# Patient Record
Sex: Female | Born: 1965 | Race: White | Hispanic: No | Smoking: Current every day smoker
Health system: Southern US, Community
[De-identification: ages and names within clinical notes are randomized; demographics above are authoritative.]

## PROBLEM LIST (undated history)

## (undated) DIAGNOSIS — C73 Malignant neoplasm of thyroid gland: Secondary | ICD-10-CM

## (undated) DIAGNOSIS — E039 Hypothyroidism, unspecified: Secondary | ICD-10-CM

## (undated) HISTORY — PX: THYROIDECTOMY: SHX17

---

## 2007-04-20 ENCOUNTER — Emergency Department: Payer: Self-pay | Admitting: Emergency Medicine

## 2007-07-20 ENCOUNTER — Emergency Department: Payer: Self-pay | Admitting: Emergency Medicine

## 2009-08-18 ENCOUNTER — Emergency Department: Payer: Self-pay | Admitting: Emergency Medicine

## 2010-10-31 ENCOUNTER — Emergency Department: Payer: Self-pay | Admitting: Emergency Medicine

## 2015-11-19 ENCOUNTER — Emergency Department
Admission: EM | Admit: 2015-11-19 | Discharge: 2015-11-19 | Disposition: A | Discharge status: Home / Self Care | Payer: Self-pay | Attending: Emergency Medicine | Admitting: Emergency Medicine

## 2015-11-19 ENCOUNTER — Encounter: Payer: Self-pay | Admitting: Emergency Medicine

## 2015-11-19 DIAGNOSIS — Y939 Activity, unspecified: Secondary | ICD-10-CM | POA: Insufficient documentation

## 2015-11-19 DIAGNOSIS — X58XXXA Exposure to other specified factors, initial encounter: Secondary | ICD-10-CM | POA: Insufficient documentation

## 2015-11-19 DIAGNOSIS — S1096XA Insect bite of unspecified part of neck, initial encounter: Secondary | ICD-10-CM | POA: Insufficient documentation

## 2015-11-19 DIAGNOSIS — W57XXXA Bitten or stung by nonvenomous insect and other nonvenomous arthropods, initial encounter: Secondary | ICD-10-CM

## 2015-11-19 DIAGNOSIS — Y929 Unspecified place or not applicable: Secondary | ICD-10-CM | POA: Insufficient documentation

## 2015-11-19 DIAGNOSIS — L089 Local infection of the skin and subcutaneous tissue, unspecified: Secondary | ICD-10-CM

## 2015-11-19 DIAGNOSIS — Z85850 Personal history of malignant neoplasm of thyroid: Secondary | ICD-10-CM | POA: Insufficient documentation

## 2015-11-19 DIAGNOSIS — F17210 Nicotine dependence, cigarettes, uncomplicated: Secondary | ICD-10-CM | POA: Insufficient documentation

## 2015-11-19 DIAGNOSIS — Y999 Unspecified external cause status: Secondary | ICD-10-CM | POA: Insufficient documentation

## 2015-11-19 HISTORY — DX: Malignant neoplasm of thyroid gland: C73

## 2015-11-19 MED ORDER — SULFAMETHOXAZOLE-TRIMETHOPRIM 800-160 MG PO TABS: 1.0000 | ORAL_TABLET | Freq: Two times a day (BID) | ORAL | 0 refills | Status: DC

## 2015-11-19 MED ORDER — TRAMADOL HCL 50 MG PO TABS: 50.0000 mg | ORAL_TABLET | Freq: Four times a day (QID) | ORAL | 0 refills | Status: DC | PRN

## 2015-11-19 NOTE — ED Triage Notes (Signed)
Red raised area back of neck x 3 days. States she does not think it started as a bite.

## 2015-11-19 NOTE — ED Provider Notes (Signed)
[facility] Emergency Department Provider Note   ____________________________________________   None    (approximate)  I have reviewed the triage vital signs and the nursing notes.   HISTORY  Chief Complaint Abscess    HPI Krista Hill is a 50 y.o. female patient complain of redness and swelling to the right posterior aspect of neck for 3 days. Patient states she does not remember insect bite or sting. Patient states pain has become worse in the past 24 hours. Patient rates the pain as 8/10. Patient described a pain as "sharp". No palliative measures taken for this complaint.   Past Medical History:  Diagnosis Date  . Thyroid cancer ([place])     There are no active problems to display for this patient.   Past Surgical History:  Procedure Laterality Date  . THYROIDECTOMY      Prior to Admission medications   Medication Sig Start Date End Date Taking? Authorizing Provider  sulfamethoxazole-trimethoprim (BACTRIM DS,SEPTRA DS) 800-160 MG tablet Take 1 tablet by mouth 2 (two) times daily. 11/19/15   Sable Feil, PA-C  traMADol (ULTRAM) 50 MG tablet Take 1 tablet (50 mg total) by mouth every 6 (six) hours as needed for moderate pain. 11/19/15   Sable Feil, PA-C    Allergies Amoxicillin  No family history on file.  Social History Social History  Substance Use Topics  . Smoking status: Current Every Day Smoker    Packs/day: 1.00    Types: Cigarettes  . Smokeless tobacco: Not on file  . Alcohol use No    Review of Systems Constitutional: No fever/chills Eyes: No visual changes. ENT: No sore throat. Cardiovascular: Denies chest pain. Respiratory: Denies shortness of breath. Gastrointestinal: No abdominal pain.  No nausea, no vomiting.  No diarrhea.  No constipation. Genitourinary: Negative for dysuria. Musculoskeletal: Negative for back pain. Skin: Negative for rash. Neurological: Negative for headaches, focal weakness or  numbness.    ____________________________________________   PHYSICAL EXAM:  VITAL SIGNS: ED Triage Vitals  Enc Vitals Group     BP 11/19/15 0932 (!) 144/78     Pulse Rate 11/19/15 0932 80     Resp 11/19/15 0932 18     Temp 11/19/15 0932 98.2 F (36.8 C)     Temp Source 11/19/15 0932 Oral     SpO2 11/19/15 0932 96 %     Weight 11/19/15 0934 165 lb (74.8 kg)     Height 11/19/15 0934 5\' 2"  (1.575 m)     Head Circumference --      Peak Flow --      Pain Score 11/19/15 0934 8     Pain Loc --      Pain Edu? --      Excl. in [place]? --     Constitutional: Alert and oriented. Well appearing and in no acute distress. Eyes: Conjunctivae are normal. PERRL. EOMI. Head: Atraumatic. Nose: No congestion/rhinnorhea. Mouth/Throat: Mucous membranes are moist.  Oropharynx non-erythematous. Neck: No stridor.  No cervical spine tenderness to palpation. Hematological/Lymphatic/Immunilogical: No cervical lymphadenopathy. Cardiovascular: Normal rate, regular rhythm. Grossly normal heart sounds.  Good peripheral circulation. Respiratory: Normal respiratory effort.  No retractions. Lungs CTAB. Gastrointestinal: Soft and nontender. No distention. No abdominal bruits. No CVA tenderness. Musculoskeletal: No lower extremity tenderness nor edema.  No joint effusions. Neurologic:  Normal speech and language. No gross focal neurologic deficits are appreciated. No gait instability. Skin:  Papular lesions on erythematous base. Psychiatric: Mood and affect are normal. Speech and  behavior are normal.  ____________________________________________   LABS (all labs ordered are listed, but only abnormal results are displayed)  Labs Reviewed - No data to display ____________________________________________  EKG   ____________________________________________  RADIOLOGY   ____________________________________________   PROCEDURES  Procedure(s) performed: None  Procedures  Critical Care performed:  No  ____________________________________________   INITIAL IMPRESSION / ASSESSMENT AND PLAN / ED COURSE  Pertinent labs & imaging results that were available during my care of the patient were reviewed by me and considered in my medical decision making (see chart for details).  Infected insect bite. Patient given discharge Instructions. Patient get a prescription for Bactrim DS and tramadol. Patient given a work note for today. Patient advised [facility].  Clinical Course     ____________________________________________   FINAL CLINICAL IMPRESSION(S) / ED DIAGNOSES  Final diagnoses:  Infected insect bite of neck, initial encounter      NEW MEDICATIONS STARTED DURING THIS VISIT:  New Prescriptions   SULFAMETHOXAZOLE-TRIMETHOPRIM (BACTRIM DS,SEPTRA DS) 800-160 MG TABLET    Take 1 tablet by mouth 2 (two) times daily.   TRAMADOL (ULTRAM) 50 MG TABLET    Take 1 tablet (50 mg total) by mouth every 6 (six) hours as needed for moderate pain.     Note:  This document was prepared using Dragon voice recognition software and may include unintentional dictation errors.    Sable Feil, PA-C 11/19/15 1000    Harvest Dark, MD 11/19/15 [phone]

## 2017-07-28 ENCOUNTER — Emergency Department: Payer: Self-pay

## 2017-07-28 ENCOUNTER — Other Ambulatory Visit: Payer: Self-pay

## 2017-07-28 ENCOUNTER — Emergency Department
Admission: EM | Admit: 2017-07-28 | Discharge: 2017-07-28 | Disposition: A | Discharge status: Home / Self Care | Payer: Self-pay | Attending: Emergency Medicine | Admitting: Emergency Medicine

## 2017-07-28 ENCOUNTER — Encounter: Payer: Self-pay | Admitting: Emergency Medicine

## 2017-07-28 DIAGNOSIS — R112 Nausea with vomiting, unspecified: Secondary | ICD-10-CM | POA: Insufficient documentation

## 2017-07-28 DIAGNOSIS — N23 Unspecified renal colic: Secondary | ICD-10-CM | POA: Insufficient documentation

## 2017-07-28 DIAGNOSIS — F17210 Nicotine dependence, cigarettes, uncomplicated: Secondary | ICD-10-CM | POA: Insufficient documentation

## 2017-07-28 DIAGNOSIS — Z85850 Personal history of malignant neoplasm of thyroid: Secondary | ICD-10-CM | POA: Insufficient documentation

## 2017-07-28 DIAGNOSIS — Z79899 Other long term (current) drug therapy: Secondary | ICD-10-CM | POA: Insufficient documentation

## 2017-07-28 LAB — LIPASE, BLOOD: LIPASE: 25 U/L (ref 11–51)

## 2017-07-28 LAB — COMPREHENSIVE METABOLIC PANEL
ALBUMIN: 3.8 g/dL (ref 3.5–5.0)
ALT: 18 U/L (ref 14–54)
AST: 21 U/L (ref 15–41)
Alkaline Phosphatase: 93 U/L (ref 38–126)
Anion gap: 6 (ref 5–15)
BILIRUBIN TOTAL: 0.6 mg/dL (ref 0.3–1.2)
BUN: 12 mg/dL (ref 6–20)
CHLORIDE: 108 mmol/L (ref 101–111)
CO2: 23 mmol/L (ref 22–32)
CREATININE: 0.59 mg/dL (ref 0.44–1.00)
Calcium: 8.7 mg/dL — ABNORMAL LOW (ref 8.9–10.3)
GFR calc Af Amer: >60 mL/min (ref >60)
GFR calc non Af Amer: >60 mL/min (ref >60)
GLUCOSE: 105 mg/dL — AB (ref 65–99)
POTASSIUM: 3.8 mmol/L (ref 3.5–5.1)
Sodium: 137 mmol/L (ref 135–145)
Total Protein: 7.6 g/dL (ref 6.5–8.1)

## 2017-07-28 LAB — CBC
HEMATOCRIT: 46.3 % (ref 35.0–47.0)
Hemoglobin: 15.9 g/dL (ref 12.0–16.0)
MCH: 31.5 pg (ref 26.0–34.0)
MCHC: 34.4 g/dL (ref 32.0–36.0)
MCV: 91.7 fL (ref 80.0–100.0)
PLATELETS: 322 10*3/uL (ref 150–440)
RBC: 5.05 MIL/uL (ref 3.80–5.20)
RDW: 12.9 % (ref 11.5–14.5)
WBC: 11.7 10*3/uL — ABNORMAL HIGH (ref 3.6–11.0)

## 2017-07-28 LAB — URINALYSIS, COMPLETE (UACMP) WITH MICROSCOPIC
BILIRUBIN URINE: NEGATIVE
Bacteria, UA: NONE SEEN
Glucose, UA: NEGATIVE mg/dL
KETONES UR: NEGATIVE mg/dL
LEUKOCYTES UA: NEGATIVE
NITRITE: NEGATIVE
PH: 6 (ref 5.0–8.0)
Protein, ur: NEGATIVE mg/dL
SPECIFIC GRAVITY, URINE: 1.041 — AB (ref 1.005–1.030)

## 2017-07-28 MED ORDER — ACETAMINOPHEN 325 MG PO TABS
650.0000 mg | ORAL_TABLET | Freq: Once | ORAL | Status: AC
  Administered 2017-07-28: 650 mg via ORAL

## 2017-07-28 MED ORDER — ACETAMINOPHEN 325 MG PO TABS
ORAL_TABLET | ORAL | Status: AC
Start: 2017-07-28 — End: 2017-07-28
  Administered 2017-07-28: 650 mg via ORAL
  Filled 2017-07-28: qty 2

## 2017-07-28 MED ORDER — IOPAMIDOL (ISOVUE-300) INJECTION 61%
30.0000 mL | Freq: Once | INTRAVENOUS | Status: AC | PRN
  Administered 2017-07-28: 30 mL via ORAL

## 2017-07-28 MED ORDER — OXYCODONE-ACETAMINOPHEN 5-325 MG PO TABS: 1.0000 | ORAL_TABLET | Freq: Four times a day (QID) | ORAL | 0 refills | Status: DC | PRN

## 2017-07-28 MED ORDER — TAMSULOSIN HCL 0.4 MG PO CAPS: 0.4000 mg | ORAL_CAPSULE | Freq: Every day | ORAL | 0 refills | Status: DC

## 2017-07-28 MED ORDER — IOPAMIDOL (ISOVUE-300) INJECTION 61%
100.0000 mL | Freq: Once | INTRAVENOUS | Status: AC | PRN
  Administered 2017-07-28: 100 mL via INTRAVENOUS

## 2017-07-28 NOTE — ED Triage Notes (Signed)
Pt presents to ED via POV with c/o LLQ abdominal pain that radiates to her back. Pt also c/o nausea and vomiting. Pt states has had this pain before and the pain eased up.

## 2017-07-28 NOTE — Discharge Instructions (Signed)
If Tylenol that you wanted to use is not enough for the pain please try the Percocet 1 or 2 pills 4 times a day as needed for pain. You can start with one wait about half an hour and if it is not enough try a second one. Do not take both Tylenol and the Percocet as that will be too much Tylenol and it could damage your liver. please follow-up with Dr. Erlene Quan a urologist. Please return here for worse pain fever or vomiting.

## 2017-07-28 NOTE — ED Notes (Signed)
Pt in NAD at this time and is able to ambulate without difficulty. Family at bedside and able to help pt home. PT verbalized understanding of follow up care and RN gave pt a urine strainer.

## 2017-07-28 NOTE — ED Notes (Signed)
Pt drinking contrast. NAD noted and no reports of nausea.

## 2017-07-28 NOTE — ED Provider Notes (Signed)
[facility] Emergency Department Provider Note   ____________________________________________   First MD Initiated Contact with Patient 07/28/17 1230     (approximate)  I have reviewed the triage vital signs and the nursing notes.   HISTORY  Chief Complaint Abdominal Pain   HPI Krista Hill is a 52 y.o. female Patient reports last week she had a brief episode of left lower quadrant pain and it went away and then today while she was in Sunday school at church pain came on fairly severely and left lower quadrant she went to try to have a bowel movement because she thought that might help but it didn't she is now having left lower quadrant pain radiating to the back is worse with movement percussion and palpation.pain is moderately severe but patient does not want any narcotics or more free and she is deathly scared of him due to all the problems in the overdoses. She just wants Tylenol.   Past Medical History:  Diagnosis Date  . Thyroid cancer ([place])     There are no active problems to display for this patient.   Past Surgical History:  Procedure Laterality Date  . THYROIDECTOMY      Prior to Admission medications   Medication Sig Start Date End Date Taking? Authorizing Provider  oxyCODONE-acetaminophen (PERCOCET/ROXICET) 5-325 MG tablet Take 1-2 tablets by mouth every 6 (six) hours as needed for severe pain. 07/28/17   Nena Polio, MD  sulfamethoxazole-trimethoprim (BACTRIM DS,SEPTRA DS) 800-160 MG tablet Take 1 tablet by mouth 2 (two) times daily. 11/19/15   Sable Feil, PA-C  tamsulosin (FLOMAX) 0.4 MG CAPS capsule Take 1 capsule (0.4 mg total) by mouth daily. 07/28/17   Nena Polio, MD  traMADol (ULTRAM) 50 MG tablet Take 1 tablet (50 mg total) by mouth every 6 (six) hours as needed for moderate pain. 11/19/15   Sable Feil, PA-C    Allergies Amoxicillin  History reviewed. No pertinent family history.  Social History Social  History   Tobacco Use  . Smoking status: Current Every Day Smoker    Packs/day: 1.00    Types: Cigarettes  . Smokeless tobacco: Never Used  Substance Use Topics  . Alcohol use: No  . Drug use: Not on file    Review of Systems  Constitutional: No fever/chills Eyes: No visual changes. ENT: No sore throat. Cardiovascular: Denies chest pain. Respiratory: Denies shortness of breath. Gastrointestinal:  abdominal pain.  nausea, no vomiting.  No diarrhea.  No constipation. Genitourinary: Negative for dysuria. Musculoskeletal: Negative for back pain. Skin: Negative for rash. Neurological: Negative for headaches, focal weakness   ____________________________________________   PHYSICAL EXAM:  VITAL SIGNS: ED Triage Vitals  Enc Vitals Group     BP 07/28/17 1148 (!) 148/79     Pulse Rate 07/28/17 1148 83     Resp 07/28/17 1148 (!) 22     Temp 07/28/17 1148 98.4 F (36.9 C)     Temp Source 07/28/17 1148 Oral     SpO2 07/28/17 1148 95 %     Weight 07/28/17 1149 170 lb (77.1 kg)     Height 07/28/17 1149 5\' 2"  (1.575 m)     Head Circumference --      Peak Flow --      Pain Score 07/28/17 1148 10     Pain Loc --      Pain Edu? --      Excl. in [place]? --     Constitutional: Alert  and oriented. Well appearing and in moderate acute distress. Eyes: Conjunctivae are normal.  Head: Atraumatic. Nose: No congestion/rhinnorhea. Mouth/Throat: Mucous membranes are moist.  Oropharynx non-erythematous. Neck: No stridor.   Cardiovascular: Normal rate, regular rhythm. Grossly normal heart sounds.  Good peripheral circulation. Respiratory: Normal respiratory effort.  No retractions. Lungs CTAB. Gastrointestinal: Soft tender to palpation percussion left lower quadrant. No distention. No abdominal bruits. No CVA tenderness. Musculoskeletal: No lower extremity tenderness nor edema.  No joint effusions. Neurologic:  Normal speech and language. No gross focal neurologic deficits are appreciated. No  gait instability. Skin:  Skin is warm, dry and intact. No rash noted. Psychiatric: Mood and affect are normal. Speech and behavior are normal.  ____________________________________________   LABS (all labs ordered are listed, but only abnormal results are displayed)  Labs Reviewed  COMPREHENSIVE METABOLIC PANEL - Abnormal; Notable for the following components:      Result Value   Glucose, Bld 105 (*)    Calcium 8.7 (*)    All other components within normal limits  CBC - Abnormal; Notable for the following components:   WBC 11.7 (*)    All other components within normal limits  URINALYSIS, COMPLETE (UACMP) WITH MICROSCOPIC - Abnormal; Notable for the following components:   Color, Urine STRAW (*)    APPearance CLEAR (*)    Specific Gravity, Urine 1.041 (*)    Hgb urine dipstick LARGE (*)    All other components within normal limits  LIPASE, BLOOD   ____________________________________________  EKG   ____________________________________________  RADIOLOGY  ED MD interpretation:    Official radiology report(s): Ct Abdomen Pelvis W Contrast  Result Date: 07/28/2017 CLINICAL DATA:  Left lower quadrant abdominal pain radiating to the back. Nausea and vomiting. Smoker. EXAM: CT ABDOMEN AND PELVIS WITH CONTRAST TECHNIQUE: Multidetector CT imaging of the abdomen and pelvis was performed using the standard protocol following bolus administration of intravenous contrast. CONTRAST:  149mL ISOVUE-300 IOPAMIDOL (ISOVUE-300) INJECTION 61% COMPARISON:  None. FINDINGS: Lower chest: Right lower lobe bullous changes. Minimal dependent atelectasis on the left. Hepatobiliary: 1.5 cm cyst at the medial aspect of the caudate lobe of the liver. Normal appearing gallbladder. Pancreas: Unremarkable. No pancreatic ductal dilatation or surrounding inflammatory changes. Spleen: Normal in size without focal abnormality. Adrenals/Urinary Tract: 5 mm calculus at the left ureteropelvic junction. Associated  mild dilatation of the left renal collecting system. Tiny lower pole right renal calculus on coronal image number 56. No bladder or right ureteral calculi. Normal appearing adrenal glands. Stomach/Bowel: Stomach is within normal limits. Appendix appears normal. No evidence of bowel wall thickening, distention, or inflammatory changes. Vascular/Lymphatic: Atheromatous arterial calcifications without aneurysm. No enlarged lymph nodes. Reproductive: Bilateral tubal ligation clips. Unremarkable uterus and ovaries. Other: Small umbilical hernia containing fat. Musculoskeletal: Mild lumbar and lower thoracic spine degenerative changes. IMPRESSION: 1. 5 mm left UPJ calculus causing mild left hydronephrosis. 2. Tiny, nonobstructing lower pole right renal calculus. 3. Right lower lobe paraseptal emphysema. Electronically Signed   By: Claudie Revering M.D.   On: 07/28/2017 14:17    ____________________________________________   PROCEDURES  Procedure(s) performed:   Procedures  Critical Care performed:   ____________________________________________   INITIAL IMPRESSION / ASSESSMENT AND PLAN / ED COURSE  by CT scan patient has renal colic. The UA versus out. There is no other sign of anything going on. I will give her some pain medicine and have her follow-up with neurology.        ____________________________________________   FINAL CLINICAL IMPRESSION(S) /  ED DIAGNOSES  Final diagnoses:  Renal colic on left side     ED Discharge Orders        Ordered    tamsulosin (FLOMAX) 0.4 MG CAPS capsule  Daily     07/28/17 1431    oxyCODONE-acetaminophen (PERCOCET/ROXICET) 5-325 MG tablet  Every 6 hours PRN     07/28/17 1431       Note:  This document was prepared using Dragon voice recognition software and may include unintentional dictation errors.    Nena Polio, MD 07/28/17 [phone]

## 2017-08-09 ENCOUNTER — Ambulatory Visit (INDEPENDENT_AMBULATORY_CARE_PROVIDER_SITE_OTHER): Payer: Self-pay | Admitting: Urology

## 2017-08-09 ENCOUNTER — Encounter: Payer: Self-pay | Admitting: Urology

## 2017-08-09 ENCOUNTER — Ambulatory Visit
Admission: RE | Admit: 2017-08-09 | Discharge: 2017-08-09 | Disposition: A | Discharge status: Home / Self Care | Payer: Self-pay | Source: Ambulatory Visit | Attending: Urology | Admitting: Urology

## 2017-08-09 VITALS — BP 137/78 | HR 97 | Ht 61.0 in | Wt 164.0 lb

## 2017-08-09 DIAGNOSIS — N201 Calculus of ureter: Secondary | ICD-10-CM | POA: Insufficient documentation

## 2017-08-09 DIAGNOSIS — Z9851 Tubal ligation status: Secondary | ICD-10-CM | POA: Insufficient documentation

## 2017-08-09 LAB — URINALYSIS, COMPLETE
Bilirubin, UA: NEGATIVE
GLUCOSE, UA: NEGATIVE
KETONES UA: NEGATIVE
LEUKOCYTES UA: NEGATIVE
Nitrite, UA: NEGATIVE
PROTEIN UA: NEGATIVE
SPEC GRAV UA: 1.025 (ref 1.005–1.030)
Urobilinogen, Ur: 0.2 mg/dL (ref 0.2–1.0)
pH, UA: 5.5 (ref 5.0–7.5)

## 2017-08-09 LAB — MICROSCOPIC EXAMINATION

## 2017-08-09 MED ORDER — ONDANSETRON HCL 4 MG PO TABS: 4.0000 mg | ORAL_TABLET | Freq: Three times a day (TID) | ORAL | 0 refills | Status: AC | PRN

## 2017-08-09 MED ORDER — OXYCODONE-ACETAMINOPHEN 5-325 MG PO TABS: 1.0000 | ORAL_TABLET | Freq: Four times a day (QID) | ORAL | 0 refills | Status: AC | PRN

## 2017-08-09 MED ORDER — TAMSULOSIN HCL 0.4 MG PO CAPS: 0.4000 mg | ORAL_CAPSULE | Freq: Every day | ORAL | 0 refills | Status: AC

## 2017-08-09 NOTE — Progress Notes (Signed)
08/09/2017 1:40 PM   IOLANI TWILLEY 02/24/1966 [id number]  Referring provider: No referring provider defined for this encounter.  Chief Complaint  Patient presents with  . Nephrolithiasis    HPI: Krista Hill is a 52 year old female who presented to the [facility] ED on 07/28/2017 with left lower quadrant abdominal pain which radiated to the left flank region.  She had associated nausea without vomiting.  There were no identifiable precipitating, aggravating or alleviating factors.  She denied fever or chills.  Her pain was controlled with parenteral analgesics.  A stone protocol CT of the abdomen pelvis showed a 5 mm left UPJ calculus and a tiny nonobstructing right lower pole renal calculus.  She denies previous history of stone disease.  Since her ED visit her pain has been controlled with oxycodone.  Her pain is presently in the suprapubic region and associated with urinary frequency, urgency and voiding small amounts.  She has run out of tamsulosin.   PMH: Past Medical History:  Diagnosis Date  . Thyroid cancer [facility])     Surgical History: Past Surgical History:  Procedure Laterality Date  . THYROIDECTOMY      Home Medications:  Allergies as of 08/09/2017      Reactions   Amoxicillin Rash      Medication List        Accurate as of 08/09/17  1:40 PM. Always use your most recent med list.          oxyCODONE-acetaminophen 5-325 MG tablet Commonly known as:  PERCOCET/ROXICET Take 1-2 tablets by mouth every 6 (six) hours as needed for severe pain.   tamsulosin 0.4 MG Caps capsule Commonly known as:  FLOMAX Take 1 capsule (0.4 mg total) by mouth daily.       Allergies:  Allergies  Allergen Reactions  . Amoxicillin Rash    Family History: History reviewed. No pertinent family history.  Social History:  reports that she has been smoking cigarettes.  She has been smoking about 1.00 pack per day. She has never used smokeless tobacco. She reports that she does not  drink alcohol. Her drug history is not on file.  ROS: UROLOGY Frequent Urination?: No Hard to postpone urination?: Yes Burning/pain with urination?: No Get up at night to urinate?: Yes Leakage of urine?: Yes Urine stream starts and stops?: Yes Trouble starting stream?: Yes Do you have to strain to urinate?: No Blood in urine?: Yes Urinary tract infection?: Yes Sexually transmitted disease?: No Injury to kidneys or bladder?: No Painful intercourse?: No Weak stream?: Yes Currently pregnant?: No Vaginal bleeding?: No Last menstrual period?: n  Gastrointestinal Nausea?: Yes Vomiting?: No Indigestion/heartburn?: No Diarrhea?: Yes Constipation?: No  Constitutional Fever: No Night sweats?: No Weight loss?: No Fatigue?: No  Skin Skin rash/lesions?: No Itching?: No  Eyes Blurred vision?: No Double vision?: No  Ears/Nose/Throat Sore throat?: No Sinus problems?: No  Hematologic/Lymphatic Swollen glands?: No Easy bruising?: No  Cardiovascular Leg swelling?: No Chest pain?: No  Respiratory Cough?: No Shortness of breath?: No  Endocrine Excessive thirst?: No  Musculoskeletal Back pain?: Yes Joint pain?: No  Neurological Headaches?: No Dizziness?: No  Psychologic Depression?: No Anxiety?: Yes  Physical Exam: BP 137/78   Pulse 97   Ht 5\' 1"  (1.549 m)   Wt 164 lb (74.4 kg)   BMI 30.99 kg/m   Constitutional:  Alert and oriented, No acute distress. HEENT: [place] AT, moist mucus membranes.  Trachea midline, no masses. Cardiovascular: No clubbing, cyanosis, or edema.  RRR Respiratory: Normal  respiratory effort, no increased work of breathing.  Lungs clear GI: Abdomen is soft, nontender, nondistended, no abdominal masses GU: No CVA tenderness Lymph: No cervical or inguinal lymphadenopathy. Skin: No rashes, bruises or suspicious lesions. Neurologic: Grossly intact, no focal deficits, moving all 4 extremities. Psychiatric: Normal mood and  affect.  Laboratory Data: Lab Results  Component Value Date   WBC 11.7 (H) 07/28/2017   HGB 15.9 07/28/2017   HCT 46.3 07/28/2017   MCV 91.7 07/28/2017   PLT 322 07/28/2017    Lab Results  Component Value Date   CREATININE 0.59 07/28/2017     Pertinent Imaging: CT was personally reviewed.  Assessment & Plan:   52 year old female with a 5 mm left ureteral calculus.  Based on her symptoms her stone has most likely migrated to the left distal ureter.  A KUB was ordered and she will be notified with results.  Management options were discussed including a continued trial of passage, ureteroscopic removal and shockwave lithotripsy.  The pros and cons of each option were discussed.  Her tamsulosin and oxycodone were refilled.  Rx for Zofran was also sent to her pharmacy.  Follow-up recommendations pending review of her KUB.    Abbie Sons, [place] [address] [place], [place] [address] [phone]

## 2017-08-12 ENCOUNTER — Telehealth: Payer: Self-pay | Admitting: Urology

## 2017-08-12 NOTE — Telephone Encounter (Signed)
Since the stone is not seen on KUB the best option for removal would be ureteroscopy.  We could repeat KUB this week if she desires.

## 2017-08-12 NOTE — Telephone Encounter (Signed)
-----   Message from Abbie Sons, MD sent at 08/12/2017  8:50 AM EDT ----- Her stone is definitely not seen on KUB.  If she still having symptoms?

## 2017-08-12 NOTE — Telephone Encounter (Signed)
Pt states she is still having left flank and back pain. Please advise

## 2017-08-14 ENCOUNTER — Other Ambulatory Visit: Payer: Self-pay | Admitting: Radiology

## 2017-08-14 DIAGNOSIS — N201 Calculus of ureter: Secondary | ICD-10-CM

## 2017-08-14 NOTE — Telephone Encounter (Signed)
Patient would like to proceed with Ureteroscopy but states it hasn't been explained to her. Should she return to clinic prior to scheduling? She complains of severe pain. States she has the urge but doesn't urinate very much. Has checked temperature with thermometer & has not had fever. Please advise.  Also need orders please.

## 2017-08-14 NOTE — Telephone Encounter (Signed)
Procedure was explained however if she is unclear would recommend a follow-up office visit.

## 2017-08-14 NOTE — Telephone Encounter (Signed)
Patient notified she states she will think this over and call back

## 2017-08-14 NOTE — Telephone Encounter (Signed)
Patient requests follow up visit to discuss surgery & wants to get a KUB prior to that appointment. KUB ordered per Dr Bernardo Heater. Appointment made. Questions anwered. Patient voices understanding.

## 2017-08-18 ENCOUNTER — Encounter: Payer: Self-pay | Admitting: Urology

## 2017-08-18 DIAGNOSIS — N201 Calculus of ureter: Secondary | ICD-10-CM | POA: Insufficient documentation

## 2017-08-20 ENCOUNTER — Ambulatory Visit (INDEPENDENT_AMBULATORY_CARE_PROVIDER_SITE_OTHER): Payer: Self-pay | Admitting: Urology

## 2017-08-20 ENCOUNTER — Encounter: Payer: Self-pay | Admitting: Urology

## 2017-08-20 VITALS — BP 152/84 | HR 86 | Ht 61.0 in | Wt 165.0 lb

## 2017-08-20 DIAGNOSIS — N201 Calculus of ureter: Secondary | ICD-10-CM

## 2017-08-20 LAB — URINALYSIS, COMPLETE
BILIRUBIN UA: NEGATIVE
Glucose, UA: NEGATIVE
Ketones, UA: NEGATIVE
LEUKOCYTES UA: NEGATIVE
Nitrite, UA: NEGATIVE
PROTEIN UA: NEGATIVE
Specific Gravity, UA: 1.02 (ref 1.005–1.030)
Urobilinogen, Ur: 0.2 mg/dL (ref 0.2–1.0)
pH, UA: 5.5 (ref 5.0–7.5)

## 2017-08-20 LAB — MICROSCOPIC EXAMINATION

## 2017-08-20 NOTE — Progress Notes (Signed)
08/20/2017 8:33 AM   Krista Hill Aug 26, 1965 [id number]  Referring provider: No referring provider defined for this encounter.  Chief Complaint  Patient presents with  . Nephrolithiasis    HPI: 52 year old female presents for follow-up of a left ureteral calculus.  She was initially interested in shockwave lithotripsy however her stone could not be identified on KUB. She presents today to discuss ureteroscopy however states she thinks she may have passed her stone yesterday as her symptoms are much improved.   PMH: Past Medical History:  Diagnosis Date  . Thyroid cancer [facility])     Surgical History: Past Surgical History:  Procedure Laterality Date  . THYROIDECTOMY      Home Medications:  Allergies as of 08/20/2017      Reactions   Amoxicillin Rash      Medication List        Accurate as of 08/20/17  8:33 AM. Always use your most recent med list.          ondansetron 4 MG tablet Commonly known as:  ZOFRAN Take 1 tablet (4 mg total) by mouth every 8 (eight) hours as needed for nausea or vomiting.   oxyCODONE-acetaminophen 5-325 MG tablet Commonly known as:  PERCOCET/ROXICET Take 1-2 tablets by mouth every 6 (six) hours as needed for severe pain.   sertraline 25 MG tablet Commonly known as:  ZOLOFT Take 25 mg by mouth daily.   tamsulosin 0.4 MG Caps capsule Commonly known as:  FLOMAX Take 1 capsule (0.4 mg total) by mouth daily.       Allergies:  Allergies  Allergen Reactions  . Amoxicillin Rash    Family History: History reviewed. No pertinent family history.  Social History:  reports that she has been smoking cigarettes.  She has been smoking about 1.00 pack per day. She has never used smokeless tobacco. She reports that she has current or past drug history. She reports that she does not drink alcohol.  ROS: UROLOGY Frequent Urination?: No Hard to postpone urination?: No Burning/pain with urination?: No Get up at night to urinate?:  No Leakage of urine?: No Urine stream starts and stops?: No Trouble starting stream?: No Do you have to strain to urinate?: No Blood in urine?: No Urinary tract infection?: No Sexually transmitted disease?: No Injury to kidneys or bladder?: No Painful intercourse?: No Weak stream?: No Currently pregnant?: No Vaginal bleeding?: No Last menstrual period?: Postmenopausal   Gastrointestinal Nausea?: No Vomiting?: No Indigestion/heartburn?: No Diarrhea?: No Constipation?: No  Constitutional Fever: No Night sweats?: No Weight loss?: No Fatigue?: No  Skin Skin rash/lesions?: No Itching?: No  Eyes Blurred vision?: No Double vision?: No  Ears/Nose/Throat Sore throat?: No Sinus problems?: No  Hematologic/Lymphatic Swollen glands?: No Easy bruising?: No  Cardiovascular Leg swelling?: No Chest pain?: No  Respiratory Cough?: No Shortness of breath?: No  Endocrine Excessive thirst?: No  Musculoskeletal Back pain?: No Joint pain?: No  Neurological Headaches?: No Dizziness?: No  Psychologic Depression?: No Anxiety?: No  Physical Exam: BP (!) 152/84 (BP Location: Left Arm, Patient Position: Sitting, Cuff Size: Normal)   Pulse 86   Ht 5\' 1"  (1.549 m)   Wt 165 lb (74.8 kg)   BMI 31.18 kg/m   Constitutional:  Alert and oriented, No acute distress. HEENT: [place] AT, moist mucus membranes.  Trachea midline, no masses. Cardiovascular: No clubbing, cyanosis, or edema. Respiratory: Normal respiratory effort, no increased work of breathing. GI: Abdomen is soft, nontender, nondistended, no abdominal masses GU: No CVA tenderness  Lymph: No cervical or inguinal lymphadenopathy. Skin: No rashes, bruises or suspicious lesions. Neurologic: Grossly intact, no focal deficits, moving all 4 extremities. Psychiatric: Normal mood and affect.   Assessment & Plan:   52 year old female with a left ureteral calculus which may have passed recently.  Since her stone was not  visualized on KUB the only accurate way to tell if the stone is still present would be a follow-up CT which she desires to pursue.  She will be notified with results.  If her stone is no longer present this was her first stone and we discussed stone prevention guidelines including drinking enough water to keep her urine output greater than 2 L/day as well as dietary oxalate moderation and a low-sodium diet.  She was provided literature on stone prevention.   Abbie Sons, [place] [address] [place], [place] [address] [phone]

## 2017-08-21 ENCOUNTER — Other Ambulatory Visit: Payer: Self-pay | Admitting: Radiology

## 2017-08-21 DIAGNOSIS — N201 Calculus of ureter: Secondary | ICD-10-CM

## 2017-08-23 LAB — CULTURE, URINE COMPREHENSIVE

## 2017-08-28 ENCOUNTER — Ambulatory Visit: Payer: Self-pay

## 2017-09-04 ENCOUNTER — Ambulatory Visit: Admission: RE | Admit: 2017-09-04 | Payer: Self-pay | Source: Ambulatory Visit

## 2017-09-07 ENCOUNTER — Encounter: Payer: Self-pay | Admitting: Emergency Medicine

## 2017-09-07 ENCOUNTER — Emergency Department: Payer: Self-pay

## 2017-09-07 DIAGNOSIS — F17210 Nicotine dependence, cigarettes, uncomplicated: Secondary | ICD-10-CM | POA: Insufficient documentation

## 2017-09-07 DIAGNOSIS — E039 Hypothyroidism, unspecified: Secondary | ICD-10-CM | POA: Insufficient documentation

## 2017-09-07 DIAGNOSIS — Z79899 Other long term (current) drug therapy: Secondary | ICD-10-CM | POA: Insufficient documentation

## 2017-09-07 DIAGNOSIS — R079 Chest pain, unspecified: Secondary | ICD-10-CM | POA: Insufficient documentation

## 2017-09-07 LAB — CBC
HCT: 49.3 % — ABNORMAL HIGH (ref 35.0–47.0)
Hemoglobin: 16.9 g/dL — ABNORMAL HIGH (ref 12.0–16.0)
MCH: 31.5 pg (ref 26.0–34.0)
MCHC: 34.4 g/dL (ref 32.0–36.0)
MCV: 91.6 fL (ref 80.0–100.0)
PLATELETS: 282 10*3/uL (ref 150–440)
RBC: 5.39 MIL/uL — AB (ref 3.80–5.20)
RDW: 13.5 % (ref 11.5–14.5)
WBC: 11 10*3/uL (ref 3.6–11.0)

## 2017-09-07 LAB — BASIC METABOLIC PANEL
Anion gap: 8 (ref 5–15)
BUN: 14 mg/dL (ref 6–20)
CALCIUM: 10.2 mg/dL (ref 8.9–10.3)
CO2: 26 mmol/L (ref 22–32)
CREATININE: 0.65 mg/dL (ref 0.44–1.00)
Chloride: 108 mmol/L (ref 98–111)
GFR calc Af Amer: >60 mL/min (ref >60)
GFR calc non Af Amer: >60 mL/min (ref >60)
Glucose, Bld: 109 mg/dL — ABNORMAL HIGH (ref 70–99)
Potassium: 3.9 mmol/L (ref 3.5–5.1)
Sodium: 142 mmol/L (ref 135–145)

## 2017-09-07 LAB — TROPONIN I: Troponin I: <0.03 ng/mL (ref <0.03)

## 2017-09-07 NOTE — ED Notes (Signed)
Patient transported to X-ray 

## 2017-09-07 NOTE — ED Triage Notes (Signed)
Patient states that that she has had central chest pain times two days. Patient states that she has had some shortness of breath, nausea and diaphoretic with the pain. Patient states that the pain has become worse.

## 2017-09-08 ENCOUNTER — Encounter: Payer: Self-pay | Admitting: Emergency Medicine

## 2017-09-08 ENCOUNTER — Emergency Department
Admission: EM | Admit: 2017-09-08 | Discharge: 2017-09-08 | Disposition: A | Discharge status: Home / Self Care | Payer: Self-pay | Attending: Emergency Medicine | Admitting: Emergency Medicine

## 2017-09-08 DIAGNOSIS — R079 Chest pain, unspecified: Secondary | ICD-10-CM

## 2017-09-08 HISTORY — DX: Hypothyroidism, unspecified: E03.9

## 2017-09-08 LAB — HEPATIC FUNCTION PANEL
ALT: 19 U/L (ref 0–44)
AST: 21 U/L (ref 15–41)
Albumin: 4 g/dL (ref 3.5–5.0)
Alkaline Phosphatase: 79 U/L (ref 38–126)
BILIRUBIN INDIRECT: 0.9 mg/dL (ref 0.3–0.9)
Bilirubin, Direct: 0.1 mg/dL (ref 0.0–0.2)
Total Bilirubin: 1 mg/dL (ref 0.3–1.2)
Total Protein: 7.7 g/dL (ref 6.5–8.1)

## 2017-09-08 LAB — TROPONIN I

## 2017-09-08 LAB — LIPASE, BLOOD: LIPASE: 29 U/L (ref 11–51)

## 2017-09-08 MED ORDER — GI COCKTAIL ~~LOC~~
30.0000 mL | Freq: Once | ORAL | Status: AC
  Administered 2017-09-08: 30 mL via ORAL
  Filled 2017-09-08: qty 30

## 2017-09-08 MED ORDER — FAMOTIDINE 40 MG PO TABS: 40.0000 mg | ORAL_TABLET | Freq: Every evening | ORAL | 0 refills | Status: AC

## 2017-09-08 NOTE — ED Notes (Signed)
Report to noel, rn.  

## 2017-09-08 NOTE — ED Notes (Signed)
Pt provided with additional warm blankets.

## 2017-09-08 NOTE — Discharge Instructions (Addendum)
Please follow-up with the acute care clinic for further evaluation of your chest pain.

## 2017-09-08 NOTE — ED Provider Notes (Signed)
[facility] Emergency Department Provider Note   ____________________________________________   First MD Initiated Contact with Patient 09/08/17 0045     (approximate)  I have reviewed the triage vital signs and the nursing notes.   HISTORY  Chief Complaint Chest Pain    HPI Krista Hill is a 52 y.o. female who comes into the hospital today with some mid chest pain.  The patient states that it started on July 5.  The pain has come and gone but is never gone away completely.  She states that it is worse when she eats or coughs.  She was not doing anything when the pain started.  She denies any shortness of breath but has had some nausea.  She states that the pain does not radiate anywhere either and she has not had any sweats.  The patient states that her pain is dull.  She took some Rolaids to see if it would help but it did not.  She states that while she has had some heartburn in the past it is never been this severe.  The patient rates her pain a 6 out of 10 in intensity currently.  She is here today for evaluation of her symptoms.  Past Medical History:  Diagnosis Date  . Hypothyroid   . Thyroid cancer [facility])     Patient Active Problem List   Diagnosis Date Noted  . Ureteral calculus, left 08/18/2017    Past Surgical History:  Procedure Laterality Date  . THYROIDECTOMY      Prior to Admission medications   Medication Sig Start Date End Date Taking? Authorizing Provider  levothyroxine (SYNTHROID, LEVOTHROID) 50 MCG tablet Take 50 mcg by mouth daily.   Yes [provider]  famotidine (PEPCID) 40 MG tablet Take 1 tablet (40 mg total) by mouth every evening. 09/08/17 09/08/18  Loney Hering, MD  ondansetron (ZOFRAN) 4 MG tablet Take 1 tablet (4 mg total) by mouth every 8 (eight) hours as needed for nausea or vomiting. Patient not taking: Reported on 09/08/2017 08/09/17   Abbie Sons, MD  oxyCODONE-acetaminophen (PERCOCET/ROXICET) 5-325  MG tablet Take 1-2 tablets by mouth every 6 (six) hours as needed for severe pain. Patient not taking: Reported on 09/08/2017 08/09/17   Abbie Sons, MD  sertraline (ZOLOFT) 25 MG tablet Take 25 mg by mouth daily. 08/15/17   [provider]  tamsulosin (FLOMAX) 0.4 MG CAPS capsule Take 1 capsule (0.4 mg total) by mouth daily. Patient not taking: Reported on 09/08/2017 08/09/17   Abbie Sons, MD    Allergies Amoxicillin  No family history on file.  Social History Social History   Tobacco Use  . Smoking status: Current Every Day Smoker    Packs/day: 1.00    Types: Cigarettes  . Smokeless tobacco: Never Used  Substance Use Topics  . Alcohol use: No  . Drug use: Not Currently    Review of Systems  Constitutional: No fever/chills Eyes: No visual changes. ENT: No sore throat. Cardiovascular: chest pain. Respiratory: Denies shortness of breath. Gastrointestinal: No abdominal pain.  No nausea, no vomiting.  No diarrhea.  No constipation. Genitourinary: Negative for dysuria. Musculoskeletal: Negative for back pain. Skin: Negative for rash. Neurological: Negative for headaches, focal weakness or numbness.   ____________________________________________   PHYSICAL EXAM:  VITAL SIGNS: ED Triage Vitals  Enc Vitals Group     BP 09/07/17 2256 139/70     Pulse Rate 09/07/17 2256 72     Resp  09/07/17 2256 18     Temp 09/07/17 2256 98 F (36.7 C)     Temp Source 09/07/17 2256 Oral     SpO2 09/07/17 2256 99 %     Weight 09/07/17 2258 165 lb (74.8 kg)     Height 09/07/17 2258 5\' 1"  (1.549 m)     Head Circumference --      Peak Flow --      Pain Score 09/07/17 2258 8     Pain Loc --      Pain Edu? --      Excl. in [place]? --     Constitutional: Alert and oriented. Well appearing and in moderate distress. Eyes: Conjunctivae are normal. PERRL. EOMI. Head: Atraumatic. Nose: No congestion/rhinnorhea. Mouth/Throat: Mucous membranes are moist.  Oropharynx  non-erythematous. Cardiovascular: Normal rate, regular rhythm. Grossly normal heart sounds.  Good peripheral circulation. Respiratory: Normal respiratory effort.  No retractions. Lungs CTAB. Gastrointestinal: Soft and nontender. No distention.  Positive bowel sounds Musculoskeletal: No lower extremity tenderness nor edema.   Neurologic:  Normal speech and language.  Skin:  Skin is warm, dry and intact.  Psychiatric: Mood and affect are normal.  ____________________________________________   LABS (all labs ordered are listed, but only abnormal results are displayed)  Labs Reviewed  BASIC METABOLIC PANEL - Abnormal; Notable for the following components:      Result Value   Glucose, Bld 109 (*)    All other components within normal limits  CBC - Abnormal; Notable for the following components:   RBC 5.39 (*)    Hemoglobin 16.9 (*)    HCT 49.3 (*)    All other components within normal limits  TROPONIN I  LIPASE, BLOOD  HEPATIC FUNCTION PANEL  TROPONIN I   ____________________________________________  EKG  ED ECG REPORT I, Loney Hering, the attending physician, personally viewed and interpreted this ECG.   Date: 09/07/2017  EKG Time: 2251  Rate: 80  Rhythm: normal sinus rhythm  Axis: normal  Intervals:none  ST&T Change: none  ____________________________________________  RADIOLOGY  ED MD interpretation:  Chest x-ray: No active cardiopulmonary disease  Official radiology report(s): Dg Chest 2 View  Result Date: 09/07/2017 CLINICAL DATA:  Central chest pressure and pain. Feels like a lump. Nausea. Symptoms started on July 4th. History of hypertension. Smoker. EXAM: CHEST - 2 VIEW COMPARISON:  None. FINDINGS: Normal heart size and pulmonary vascularity. No focal airspace disease or consolidation in the lungs. No blunting of costophrenic angles. No pneumothorax. Mediastinal contours appear intact. Degenerative changes in the spine and shoulders. Surgical clips at the  base of the neck. IMPRESSION: No active cardiopulmonary disease. Electronically Signed   By: Lucienne Capers M.D.   On: 09/07/2017 23:24    ____________________________________________   PROCEDURES  Procedure(s) performed: None  Procedures  Critical Care performed: No  ____________________________________________   INITIAL IMPRESSION / ASSESSMENT AND PLAN / ED COURSE  As part of my medical decision making, I reviewed the following data within the electronic MEDICAL RECORD NUMBER Notes from prior ED visits and [place] Controlled Substance Database   This is a 52 year old female who comes into the hospital today with some mid chest pain.  The patient states that it is worse when she eats or drinks.  I initially offered the patient some GI cocktail and nitroglycerin.  The patient was very anxious about taking nitroglycerin so I told her we would treat her with a GI cocktail only to see if that does help her pain.  The patient had some blood work drawn to include a CBC, BMP and a troponin which were unremarkable.  The patient states that the GI cocktail did help her pain.  She also had a chest x-ray which was unremarkable and a repeat troponin which was negative.  The patient will be discharged home to follow-up with her primary care physician or the acute care clinic.  The patient has no further questions or concerns.      ____________________________________________   FINAL CLINICAL IMPRESSION(S) / ED DIAGNOSES  Final diagnoses:  Chest pain, unspecified type     ED Discharge Orders        Ordered    famotidine (PEPCID) 40 MG tablet  Every evening     09/08/17 0439       Note:  This document was prepared using Dragon voice recognition software and may include unintentional dictation errors.    Loney Hering, MD 09/08/17 [phone]

## 2017-09-08 NOTE — ED Notes (Signed)
Discharge instructions reviewed with patient. Questions fielded by this RN. Patient verbalizes understanding of instructions. Patient discharged home in stable condition per webster. No acute distress noted at time of discharge.   No peripheral IV placed this visit.

## 2017-09-08 NOTE — ED Notes (Signed)
Specimen sent to lab.

## 2017-09-10 ENCOUNTER — Telehealth: Payer: Self-pay | Admitting: Urology

## 2017-09-10 NOTE — Telephone Encounter (Signed)
pt no showed stated she was better and did not want to have it done. 09-10-17 MB

## 2018-09-15 ENCOUNTER — Ambulatory Visit: Payer: Self-pay | Admitting: Urology

## 2019-01-02 IMAGING — CT CT ABD-PELV W/ CM
2 of 5 series · 16 of 46 positions shown, 18 images · IV contrast (APPLIED)
Comparison: None.

CLINICAL DATA: Left lower quadrant abdominal pain radiating to the
back. Nausea and vomiting. Smoker.

EXAM:
CT ABDOMEN AND PELVIS WITH CONTRAST
TECHNIQUE: Multidetector CT imaging of the abdomen and pelvis was performed
using the standard protocol following bolus administration of
intravenous contrast.
CONTRAST:  100mL ICASYK-P00 IOPAMIDOL (ICASYK-P00) INJECTION 61%

[Series 2: routine abd/pel with · axial · 0.79mm/px · z∈[-492,-72]mm · 13 of 94 slices shown, 15 images]
[im 5/94  soft-tissue]
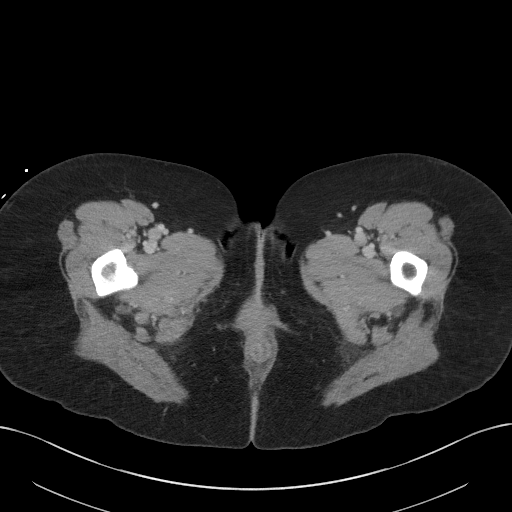
[im 5/94  bone]
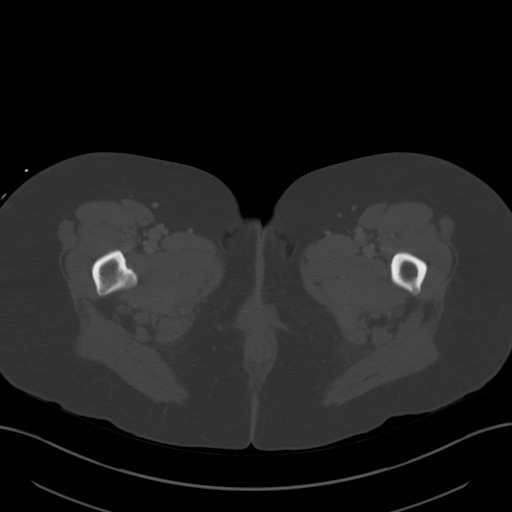
[im 15/94  soft-tissue]
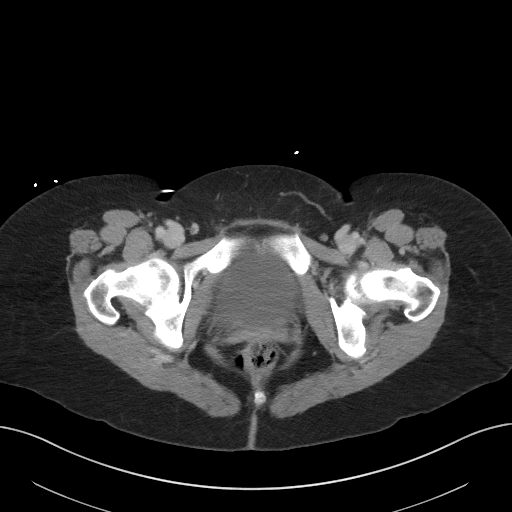
[im 20/94  soft-tissue]
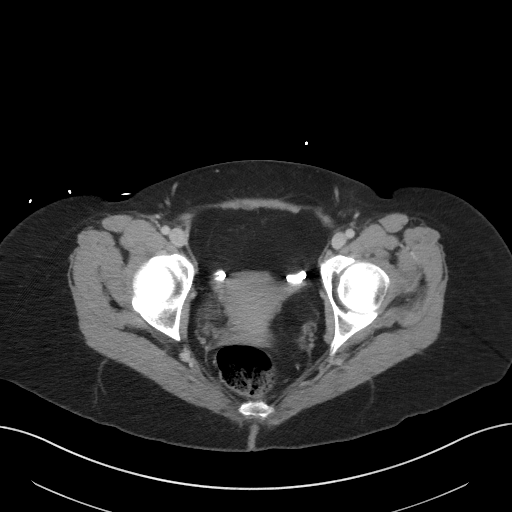
[im 25/94  soft-tissue]
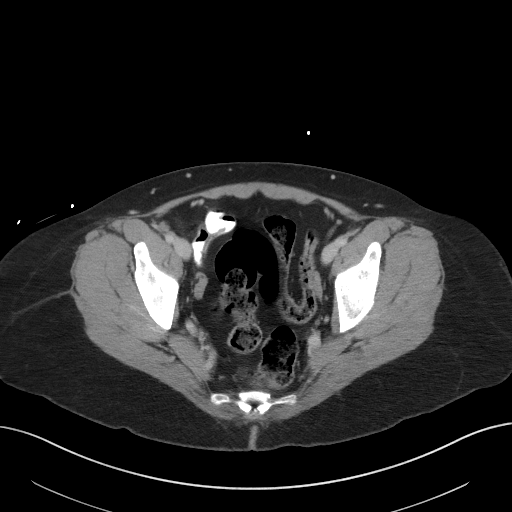
[im 35/94  soft-tissue]
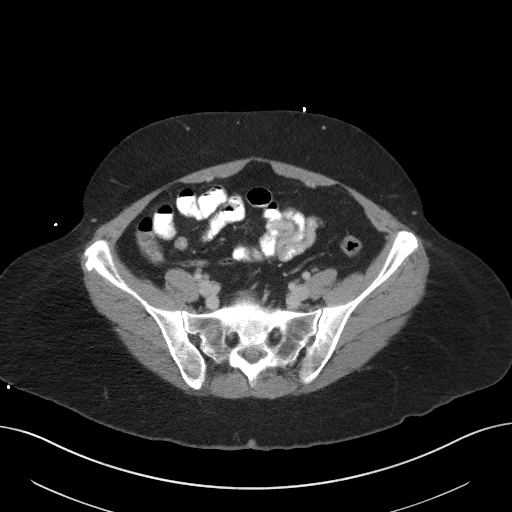
[im 40/94  soft-tissue]
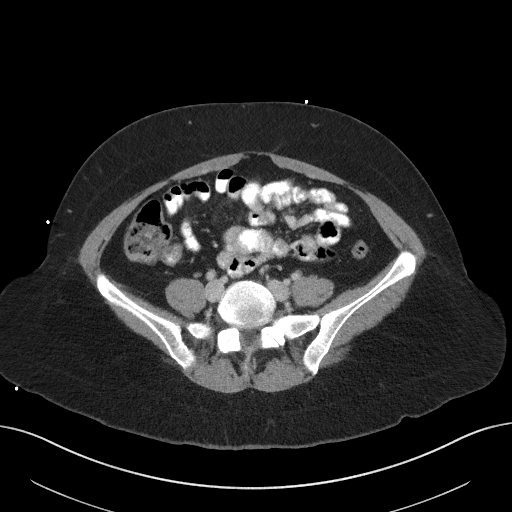
[im 49/94  soft-tissue]
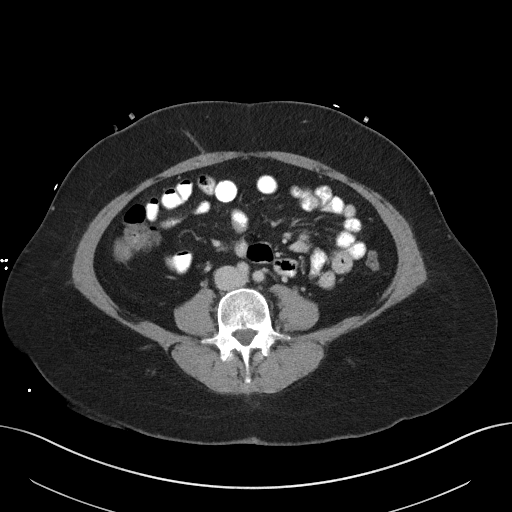
[im 54/94  soft-tissue]
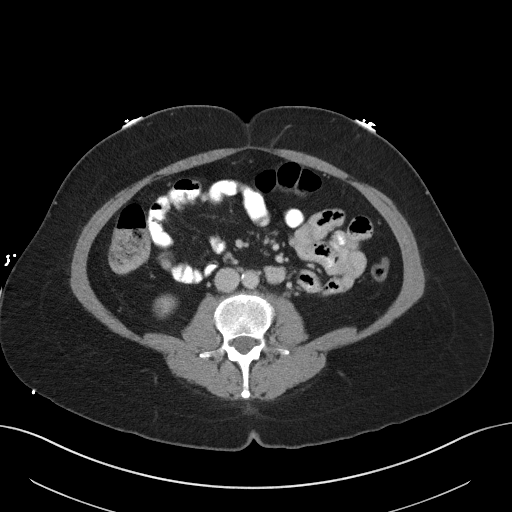
[im 59/94  soft-tissue]
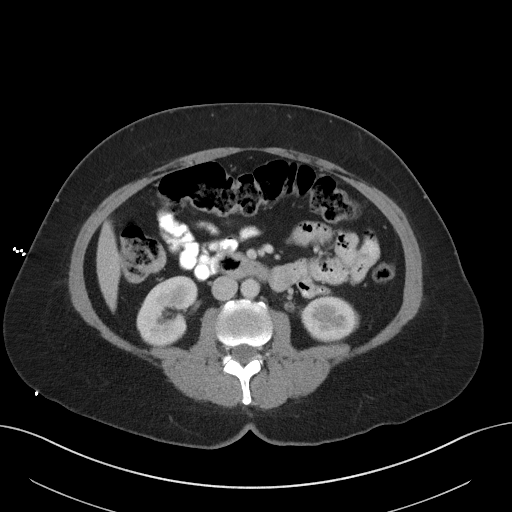
[im 59/94  bone]
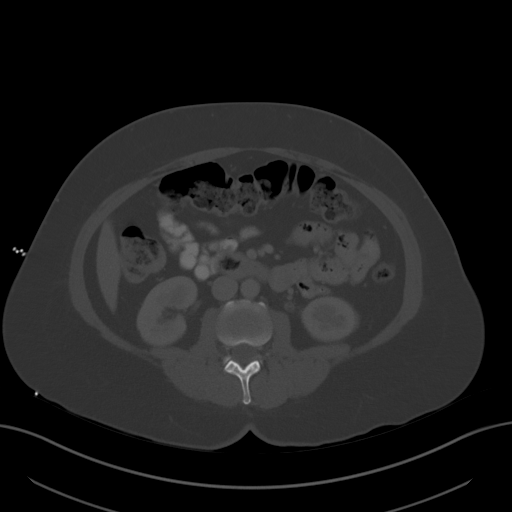
[im 69/94  soft-tissue]
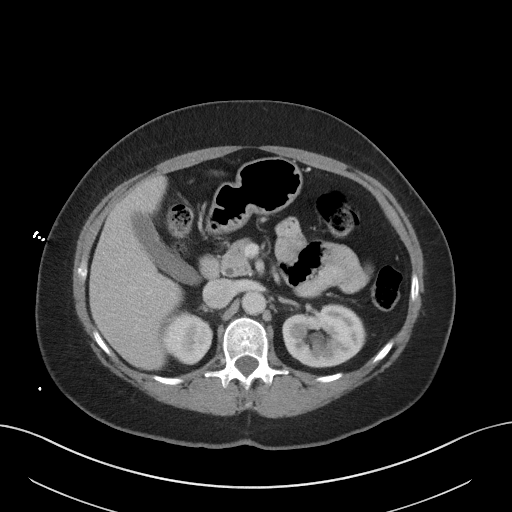
[im 74/94  soft-tissue]
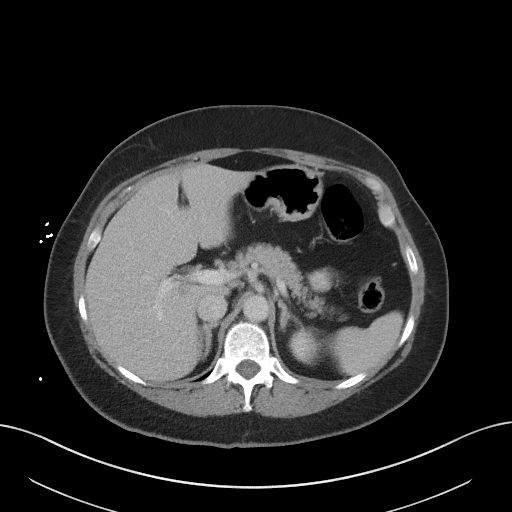
[im 79/94  soft-tissue]
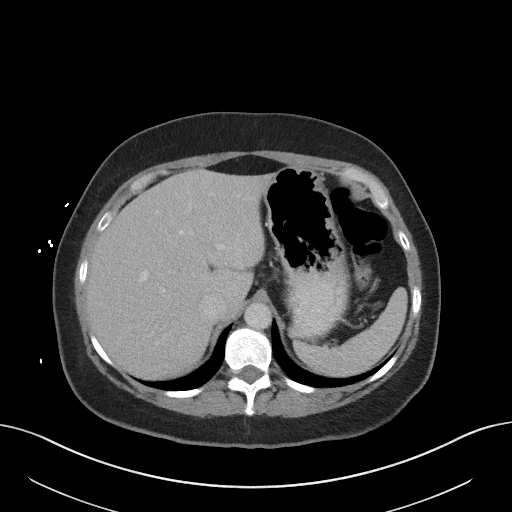
[im 89/94  soft-tissue]
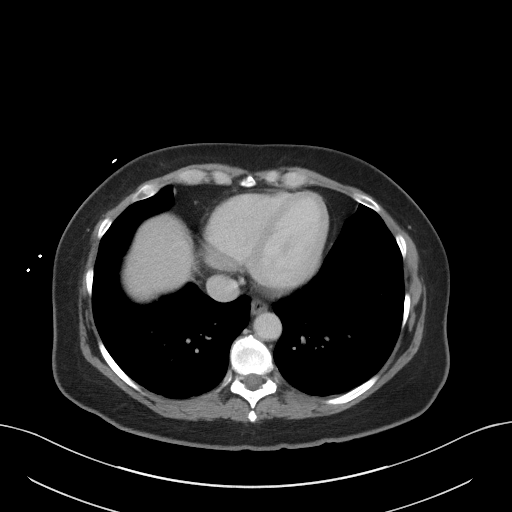

[Series 5: coronal st · coronal · 0.70mm/px · 3 of 88 slices shown]
[im 30/88  soft-tissue]
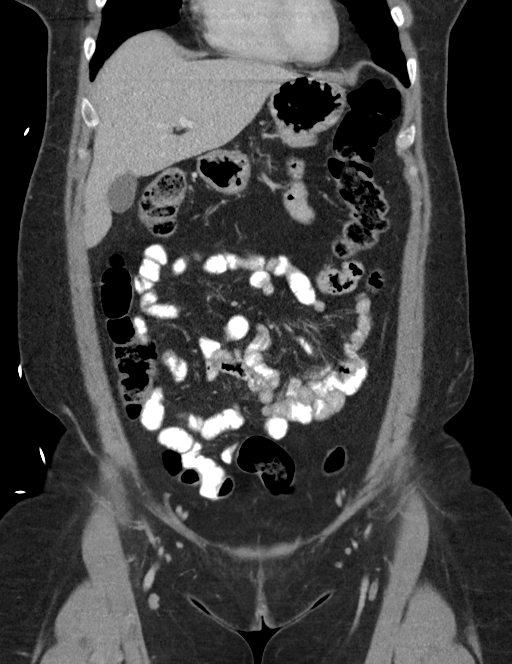
[im 39/88  soft-tissue]
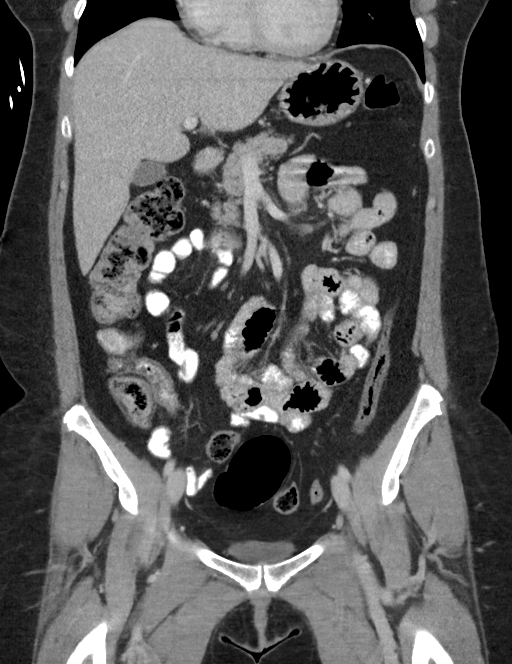
[im 49/88  soft-tissue]
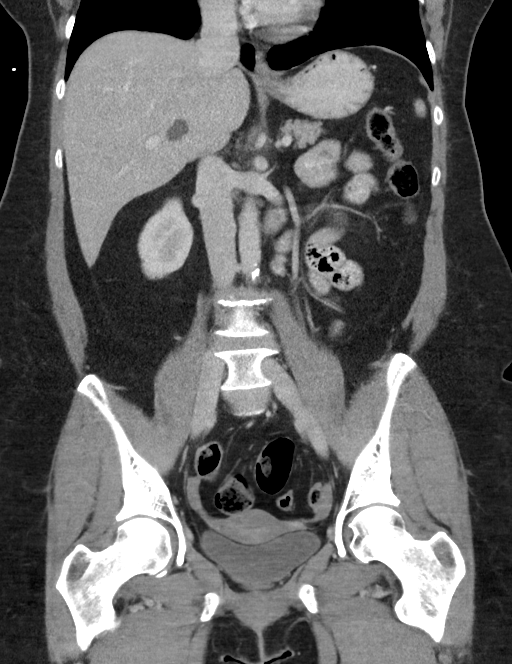

[16 of 46 positions shown; findings below may reference images not displayed]

FINDINGS: Lower chest: Right lower lobe bullous changes. Minimal dependent
atelectasis on the left.

Hepatobiliary: 1.5 cm cyst at the medial aspect of the caudate lobe
of the liver. Normal appearing gallbladder.

Pancreas: Unremarkable. No pancreatic ductal dilatation or
surrounding inflammatory changes.

Spleen: Normal in size without focal abnormality.

Adrenals/Urinary Tract: 5 mm calculus at the left ureteropelvic
junction. Associated mild dilatation of the left renal collecting
system. Tiny lower pole right renal calculus on coronal image number
56. No bladder or right ureteral calculi. Normal appearing adrenal
glands.

Stomach/Bowel: Stomach is within normal limits. Appendix appears
normal. No evidence of bowel wall thickening, distention, or
inflammatory changes.

Vascular/Lymphatic: Atheromatous arterial calcifications without
aneurysm. No enlarged lymph nodes.

Reproductive: Bilateral tubal ligation clips. Unremarkable uterus
and ovaries.

Other: Small umbilical hernia containing fat.

Musculoskeletal: Mild lumbar and lower thoracic spine degenerative
changes.
IMPRESSION: 1. 5 mm left UPJ calculus causing mild left hydronephrosis.
2. Tiny, nonobstructing lower pole right renal calculus.
3. Right lower lobe paraseptal emphysema.

## 2019-01-14 IMAGING — CR DG ABDOMEN 1V
1 series · 2 of 2 positions shown · non-contrast
Comparison: Abdominal CT 07/28/2017

CLINICAL DATA: Ureteral calculus

EXAM:
ABDOMEN - 1 VIEW

[Series 1: dg abd 1 view · 0.14mm/px · 2 of 2 slices shown]
[im 1/2]
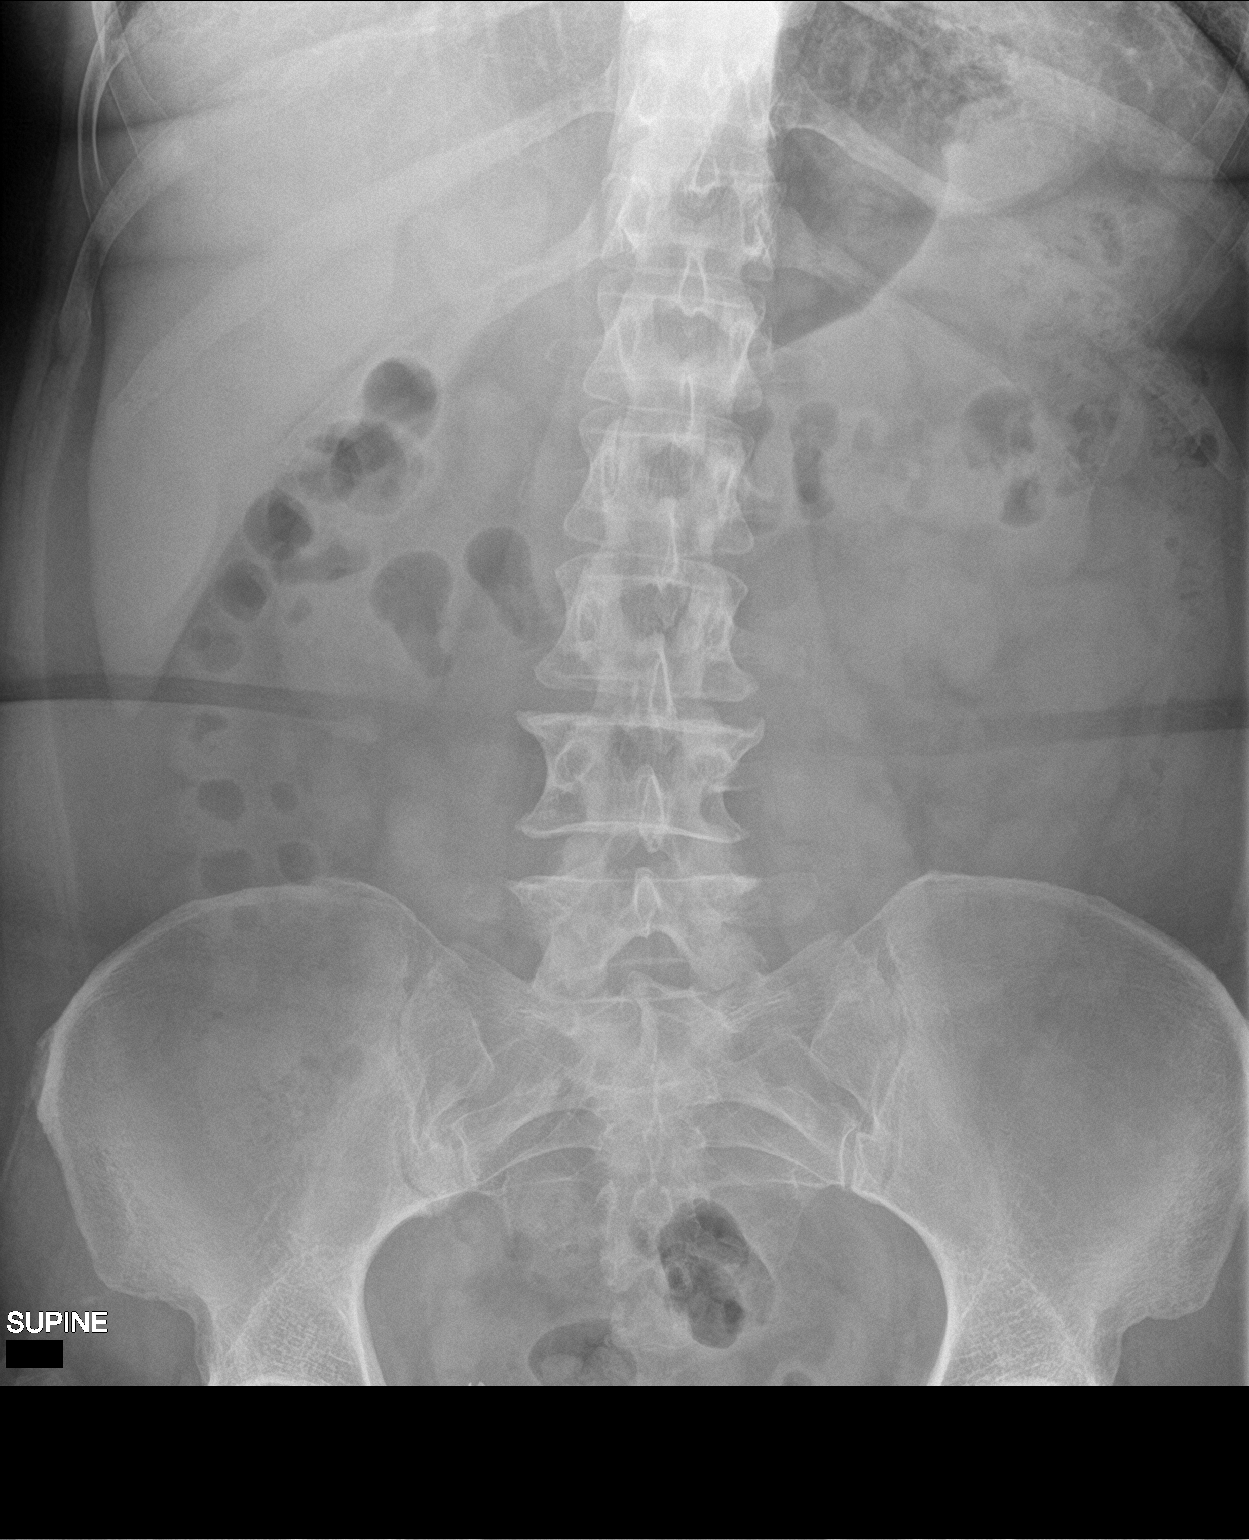
[im 2/2]
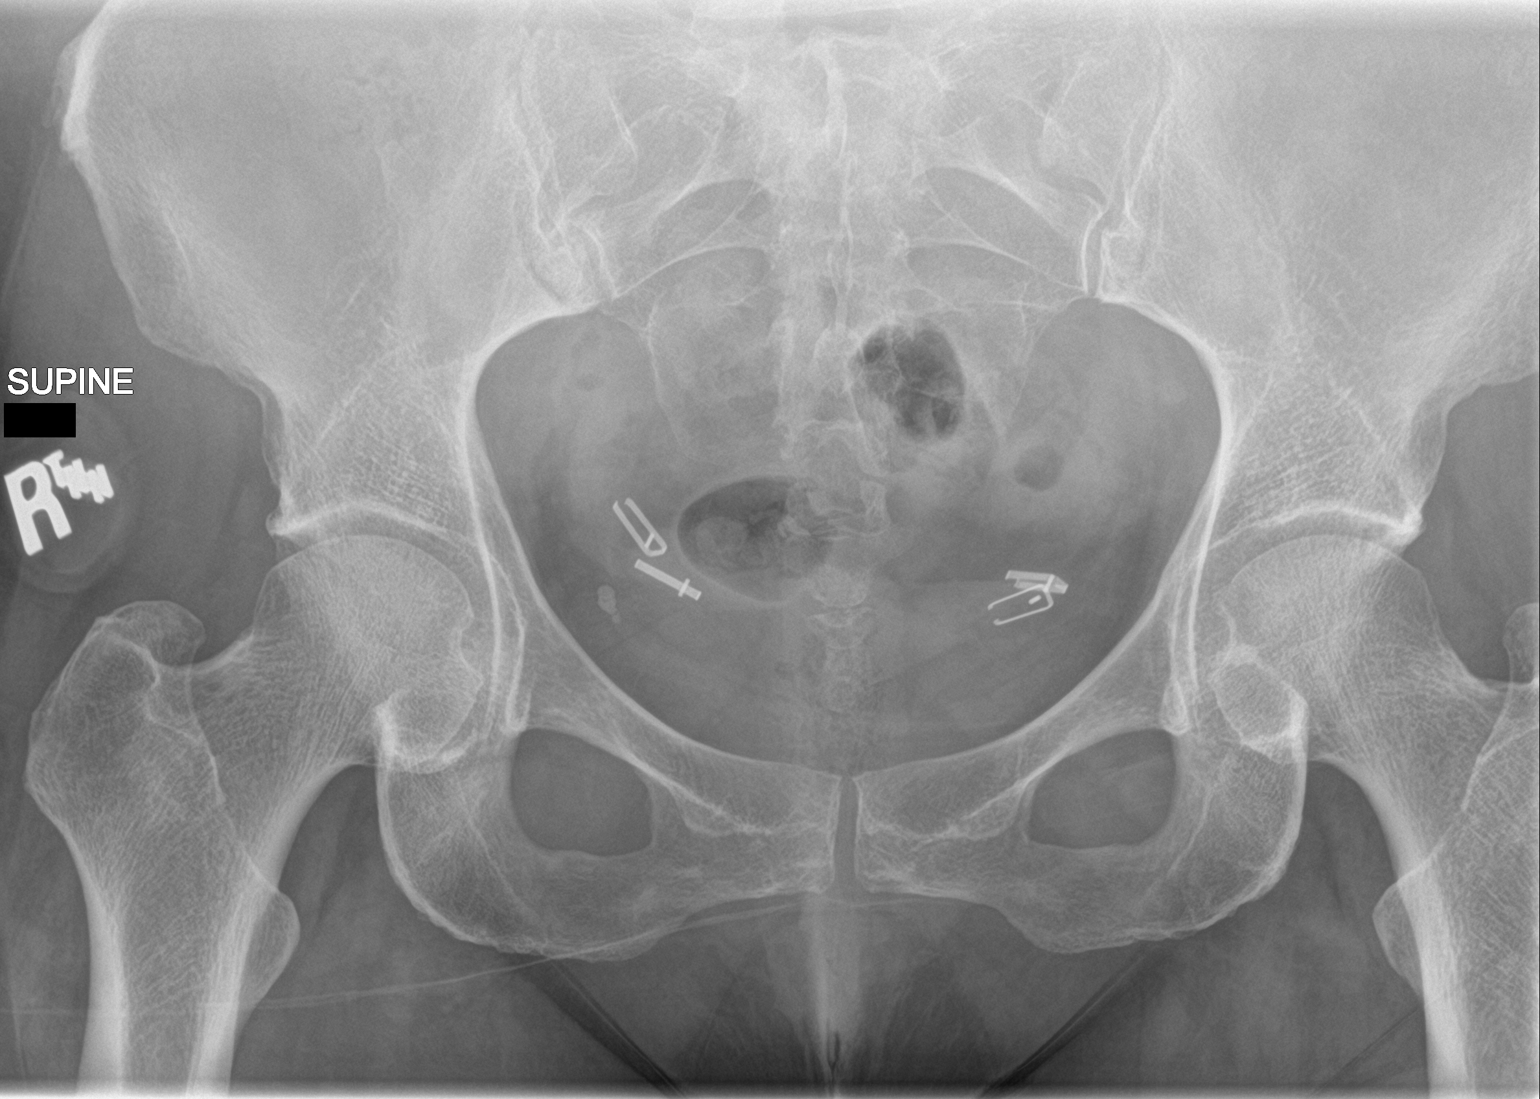

[2 of 2 positions shown; findings below may reference images not displayed]

FINDINGS: Right pelvic calcifications are phleboliths.

5 mm stone versus stool over the left kidney.

Tubal ligation clips.

Normal bowel gas pattern.
IMPRESSION: Stool versus 5 mm stone over the left kidney.

## 2019-02-12 IMAGING — CR DG CHEST 2V
1 series · 2 of 2 positions shown · non-contrast
Comparison: None.

CLINICAL DATA: Central chest pressure and pain. Feels like a lump.
Nausea. Symptoms started on [REDACTED]. History of hypertension.
Smoker.

EXAM:
CHEST - 2 VIEW

[Series 1: dg chest 2 view · 0.14mm/px · 2 of 2 slices shown]
[im 1/2]
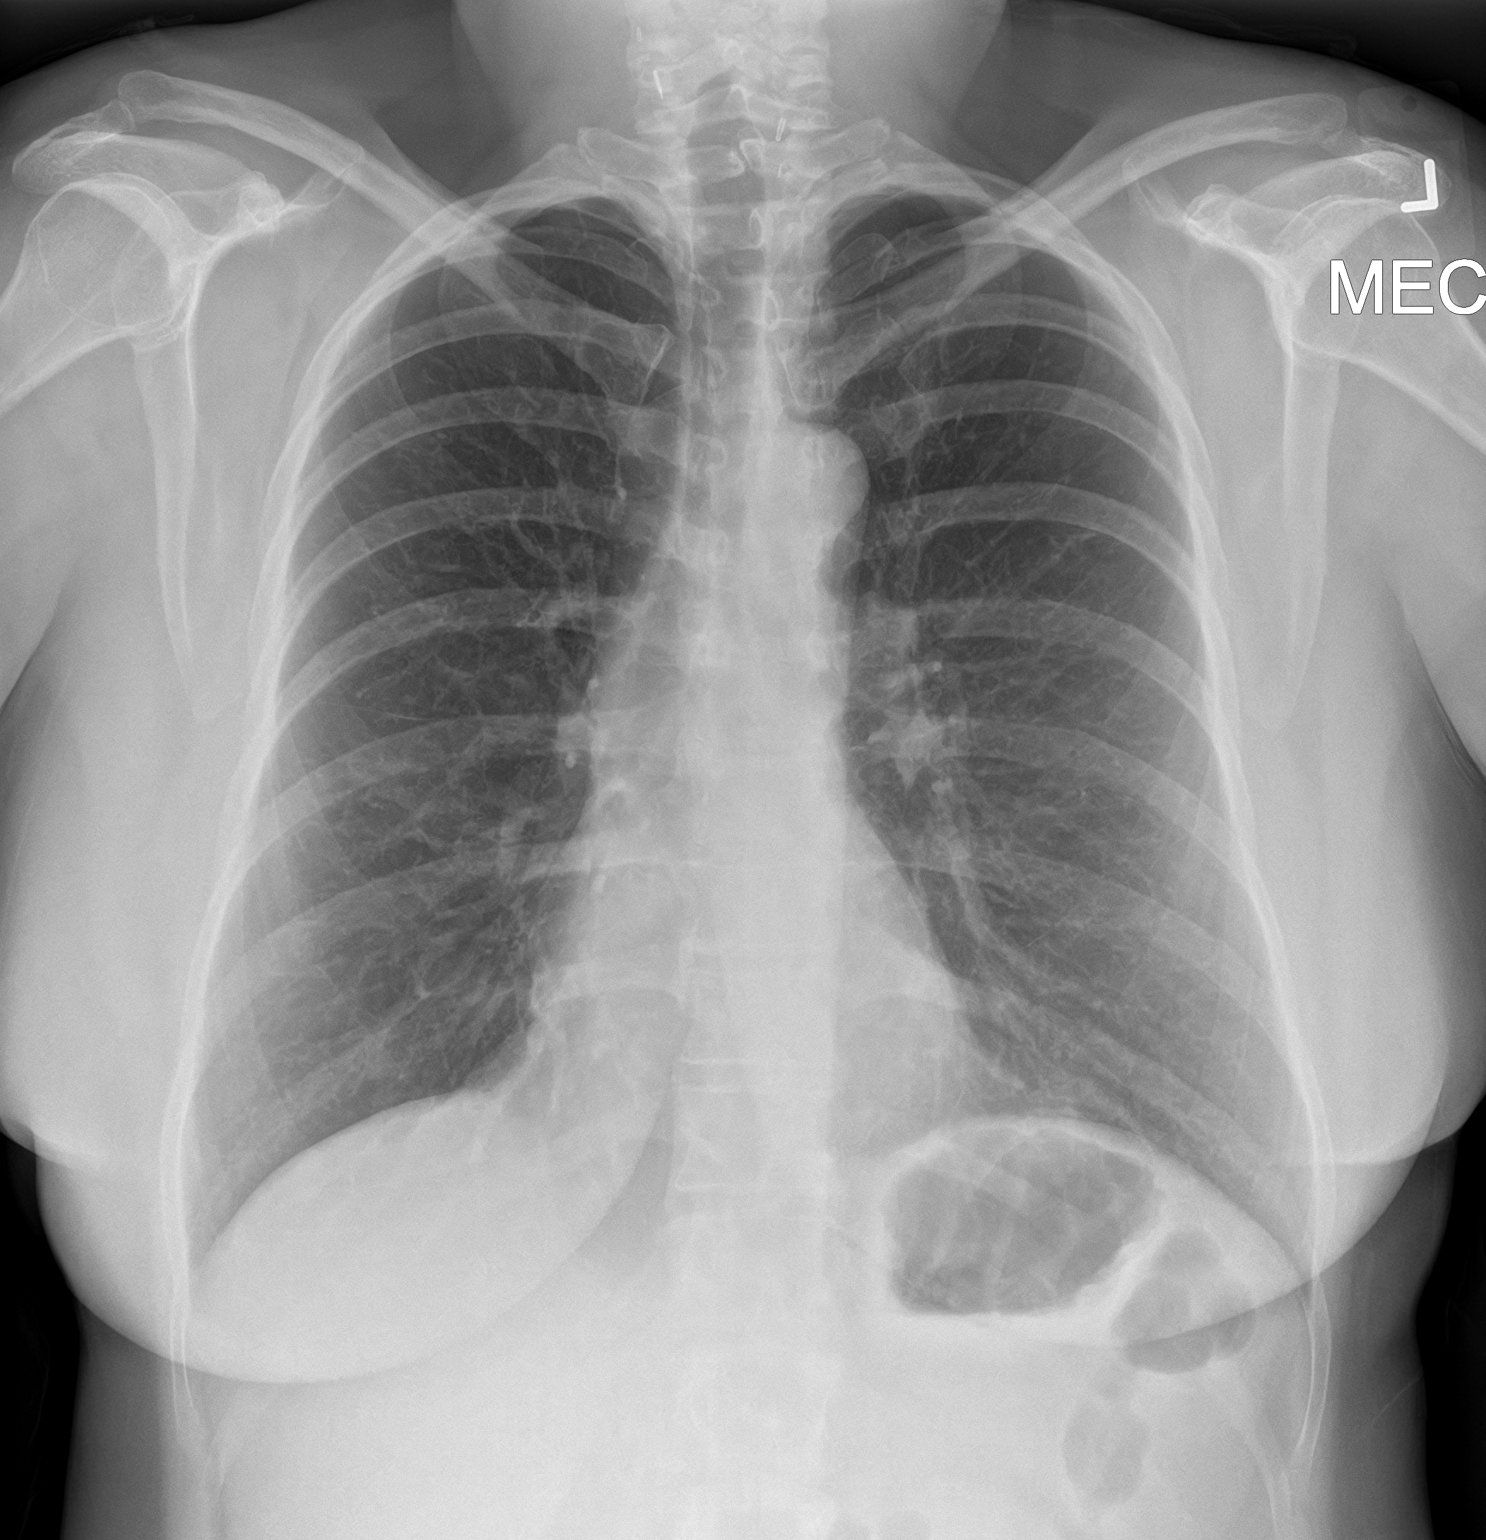
[im 2/2]
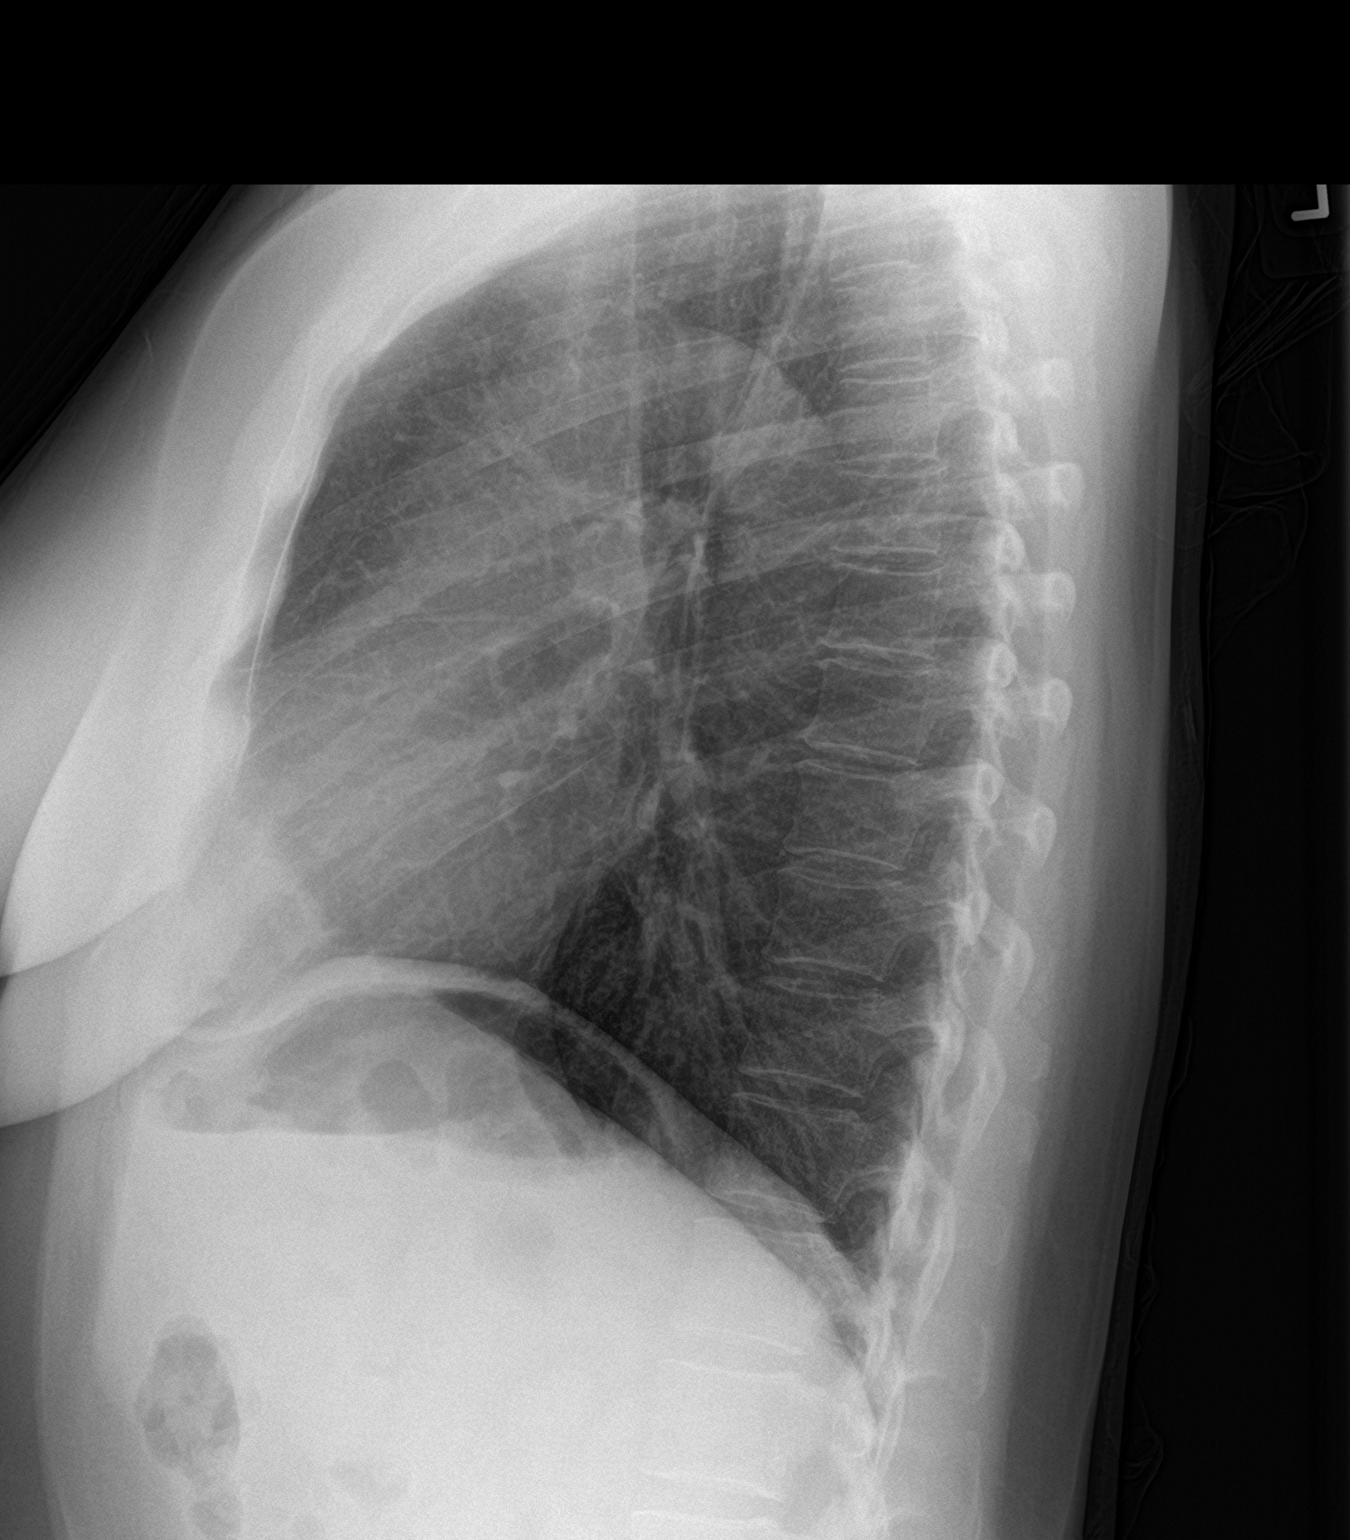

[2 of 2 positions shown; findings below may reference images not displayed]

FINDINGS: Normal heart size and pulmonary vascularity. No focal airspace
disease or consolidation in the lungs. No blunting of costophrenic
angles. No pneumothorax. Mediastinal contours appear intact.
Degenerative changes in the spine and shoulders. Surgical clips at
the base of the neck.
IMPRESSION: No active cardiopulmonary disease.
